# Patient Record
Sex: Female | Born: 1960 | Race: Black or African American | Hispanic: No | Marital: Married | Smoking: Never smoker
Health system: Southern US, Community
[De-identification: ages and names within clinical notes are randomized; demographics above are authoritative.]

## PROBLEM LIST (undated history)

## (undated) DIAGNOSIS — E785 Hyperlipidemia, unspecified: Secondary | ICD-10-CM

---

## 1997-12-06 ENCOUNTER — Other Ambulatory Visit: Admission: RE | Admit: 1997-12-06 | Discharge: 1997-12-06 | Payer: Self-pay | Admitting: Gynecology

## 1998-12-09 ENCOUNTER — Other Ambulatory Visit: Admission: RE | Admit: 1998-12-09 | Discharge: 1998-12-09 | Payer: Self-pay | Admitting: Gynecology

## 1999-12-30 ENCOUNTER — Other Ambulatory Visit: Admission: RE | Admit: 1999-12-30 | Discharge: 1999-12-30 | Payer: Self-pay | Admitting: Gynecology

## 2000-08-10 HISTORY — PX: ABDOMINAL HYSTERECTOMY: SHX81

## 2001-01-24 ENCOUNTER — Other Ambulatory Visit: Admission: RE | Admit: 2001-01-24 | Discharge: 2001-01-24 | Payer: Self-pay | Admitting: Gynecology

## 2001-03-07 ENCOUNTER — Encounter (INDEPENDENT_AMBULATORY_CARE_PROVIDER_SITE_OTHER): Payer: Self-pay

## 2001-03-07 ENCOUNTER — Inpatient Hospital Stay (HOSPITAL_COMMUNITY): Admission: RE | Admit: 2001-03-07 | Discharge: 2001-03-09 | Payer: Self-pay | Admitting: Gynecology

## 2002-01-18 ENCOUNTER — Other Ambulatory Visit: Admission: RE | Admit: 2002-01-18 | Discharge: 2002-01-18 | Payer: Self-pay | Admitting: Gynecology

## 2003-01-23 ENCOUNTER — Other Ambulatory Visit: Admission: RE | Admit: 2003-01-23 | Discharge: 2003-01-23 | Payer: Self-pay | Admitting: Gynecology

## 2003-01-26 ENCOUNTER — Encounter: Admission: RE | Admit: 2003-01-26 | Discharge: 2003-01-26 | Payer: Self-pay | Admitting: Gynecology

## 2003-01-26 ENCOUNTER — Encounter: Payer: Self-pay | Admitting: Gynecology

## 2004-01-29 ENCOUNTER — Other Ambulatory Visit: Admission: RE | Admit: 2004-01-29 | Discharge: 2004-01-29 | Payer: Self-pay | Admitting: Gynecology

## 2004-02-27 ENCOUNTER — Ambulatory Visit (HOSPITAL_COMMUNITY): Admission: RE | Admit: 2004-02-27 | Discharge: 2004-02-27 | Payer: Self-pay | Admitting: Orthopedic Surgery

## 2005-03-18 ENCOUNTER — Other Ambulatory Visit: Admission: RE | Admit: 2005-03-18 | Discharge: 2005-03-18 | Payer: Self-pay | Admitting: Gynecology

## 2005-03-19 ENCOUNTER — Encounter: Admission: RE | Admit: 2005-03-19 | Discharge: 2005-03-19 | Payer: Self-pay | Admitting: Gynecology

## 2006-03-15 ENCOUNTER — Other Ambulatory Visit: Admission: RE | Admit: 2006-03-15 | Discharge: 2006-03-15 | Payer: Self-pay | Admitting: Gynecology

## 2008-05-01 ENCOUNTER — Ambulatory Visit (HOSPITAL_COMMUNITY): Admission: RE | Admit: 2008-05-01 | Discharge: 2008-05-01 | Payer: Self-pay | Admitting: Gynecology

## 2009-06-27 ENCOUNTER — Ambulatory Visit (HOSPITAL_COMMUNITY): Admission: RE | Admit: 2009-06-27 | Discharge: 2009-06-27 | Payer: Self-pay | Admitting: Gynecology

## 2010-12-26 NOTE — Op Note (Signed)
[facility]  Patient:    Erica Cameron, Erica Cameron            MRN: [id number] Proc. Date: 03/07/01 Adm. Date:  [id number] Attending:  Katrina Stack                           Operative Report  PREOPERATIVE DIAGNOSES:  Uterine leiomyomata with dyspareunia and diminished bladder capacity, 18-20 week size.  POSTOPERATIVE DIAGNOSES:  Uterine leiomyomata with dyspareunia and diminished bladder capacity, 18-20 week size.  PROCEDURES:  Total abdominal hysterectomy.  SURGEON:  Gretta Cool, M.D.  ASSISTANT:  Raynald Kemp, M.D.  ANESTHESIA:  General orotracheal.  DESCRIPTION OF PROCEDURE:  Under excellent anesthesia as above, the patients prepped and draped as a sterile field, a _____ incision was made.  The incision was extended through the fascia.  The rectus muscles were then separated in the midline and the peritoneum opened.  The abdomen was then explored and there were no abnormalities in the upper abdomen.  The cecum was very mobile and the appendix was normal.  The uterus was approximately 20-week size with multiple large fibroleiomyoma protruding the fundus; they were largely subserosal.  The uterus was mobilized and delivered up through the incision.  The adnexal pedicle was then clamped across with [facility] clamps.  The round ligaments were then transected with cautery.  The anterior leaf of the round ligament was then dissected off the lower uterine segment and bladder pushed beyond the cervix.  The ovarian ligaments on each side were then clamped, cut, sutured, and tied with 0 Vicryl.  A second free tie was used to doubly ligate the ovarian pedicle.  The uterine vessels were then skeletonized, clamp, cut, sutured, and tied with 0 Vicryl.  The cardinal and uterosacral ligaments were then progressively clamp, cut, sutured, and tied with 0 Vicryl.  The cervix was then excised and the vagina closed with running suture of 0 Vicryl.  At this  point a uterosacral suspension procedure was done with 0 Ethibond. The _____ were all dry at this point, and the pelvic peritoneum was closed with a running suture of 2-0 Monocryl.  The ovaries and tubes were suspended at each round ligament.   The packs and retractors were then removed.  Pelvis was irrigated to remove all debris, and the organs returned to their normal location.  The abdominal peritoneum was then closed with a running suture of 0 Monocryl.  The rectus muscles were also plicated in the midline with a running suture of 0 Monocryl.  The fascia was then approximated with a running suture of 0 Vicryl from each angle of the midline.  Subcutaneous tissue was approximated with interrupted sutures of 3-0 Vicryl and skin closed with skin staples and Steri-STrips.  At the end of the procedure sponge, instrument, and needle counts were correct.  There was no complications.  The patient returned to the recovery room in excellent condition. DD:  03/08/01 TD:  03/08/01 Job: [id number]

## 2010-12-26 NOTE — H&P (Signed)
[facility]  Patient:    Erica Cameron, Erica Cameron            MRN: [id number] Adm. Date:  [id number] Attending:  Katrina Stack                         History and Physical  CHIEF COMPLAINT:  Uterine fibroids and abnormal bleeding.  HISTORY OF PRESENT ILLNESS:  A 50 year old white gravida 1, para 1 with rapid growth of uterine leiomyomata now to approximately 18 weeks size with uterus rising to within 1 cm of the umbilicus.  She has multiple knobby leiomyomata protruding from the fundus.  She also has continued growth of the uterine leiomyomata in spite of hormone replacement suppression.  She has nocturia three to four times per night with diminished bladder capacity all of the time.  She does also experience pain with intercourse which has been reasonably well controlled on Ortho-Cyclen with reasonable cycles and diminished menstrual flow.  She is now sufficiently uncomfortable, however, that she wishes to proceed with definitive therapy.  PAST MEDICAL HISTORY:  Usual childhood diseases without sequela. Accidents/injuries:  None.  Previous hospitalizations for childbirth only.  ALLERGIES:  None.  FAMILY HISTORY:  Mother died of heart disease at age 84.  Father died of lung cancer.  One sister has renal failure and hypertension.  One has diabetes.  CURRENT MEDICATIONS:  Ortho-Cyclen.  SOCIAL HISTORY:  Patient is recently remarried to long-term partner.  She works for [employer].  Her child is grown and living alone.  PHYSICAL EXAMINATION  GENERAL:  Well-developed, thin black female.  HEENT:  Pupils are equal, round and reactive to light and accommodate.  Fundi not examined.  Oropharynx:  Clear.  NECK:  Supple without mass or thyroid enlargement.  CHEST:  Clear PTA.  HEART:  Regular rhythm without murmur or cardiac enlargement.  BREASTS:  Soft without mass, nodes, or nipple discharge.  ABDOMEN:  Soft with a palpable mass  rising to within 1 cm of the umbilicus with quite mobile fundus, but multiple leiomyomata palpable bulging the anterior abdominal wall to the appearance of approximately 18-20 weeks size pregnancy.  There are no other masses.  PELVIC:  External genitalia:  Normal female.  Vagina:  Clean, rugose.  Cervix is pulled high up into the vagina.  The uterus is extraordinarily large with multiple leiomyomata palpable.  Ovaries cannot be palpated.  Rectovaginal examination confirms.  EXTREMITIES:  Negative.  NEUROLOGIC:  Physiologic.  IMPRESSION:  Uterine leiomyomata with approximately 20 weeks size with dyspareunia and diminished bladder capacity.  PLAN:  Total abdominal hysterectomy, possible salpingo-oophorectomy. Discussed with patient the risks, benefits, alternatives.  Discussed management of pain or discomfort in recovery, possible need for hormone replacement, etcetera.  She is now admitted for definitive therapy by total abdominal hysterectomy, possible salpingo-oophorectomy. DD:  03/08/01 TD:  03/08/01 Job: [id number]

## 2012-05-02 ENCOUNTER — Other Ambulatory Visit: Payer: Self-pay | Admitting: Internal Medicine

## 2012-05-03 ENCOUNTER — Ambulatory Visit (INDEPENDENT_AMBULATORY_CARE_PROVIDER_SITE_OTHER): Payer: PRIVATE HEALTH INSURANCE | Admitting: Internal Medicine

## 2012-05-03 ENCOUNTER — Encounter: Payer: Self-pay | Admitting: Internal Medicine

## 2012-05-03 VITALS — BP 126/90 | HR 84 | Temp 98.2°F | Ht 60.0 in | Wt 199.0 lb

## 2012-05-03 DIAGNOSIS — Z79899 Other long term (current) drug therapy: Secondary | ICD-10-CM

## 2012-05-03 DIAGNOSIS — E785 Hyperlipidemia, unspecified: Secondary | ICD-10-CM

## 2012-05-03 DIAGNOSIS — E669 Obesity, unspecified: Secondary | ICD-10-CM

## 2012-05-03 LAB — HEPATIC FUNCTION PANEL
Albumin: 4.2 g/dL (ref 3.5–5.2)
Total Bilirubin: 0.7 mg/dL (ref 0.3–1.2)
Total Protein: 7.6 g/dL (ref 6.0–8.3)

## 2012-05-03 LAB — LIPID PANEL
HDL: 58 mg/dL (ref >39)
LDL Cholesterol: 116 mg/dL — ABNORMAL HIGH (ref 0–99)
Triglycerides: 90 mg/dL (ref <150)
VLDL: 18 mg/dL (ref 0–40)

## 2012-05-08 ENCOUNTER — Encounter: Payer: Self-pay | Admitting: Internal Medicine

## 2012-05-08 DIAGNOSIS — E669 Obesity, unspecified: Secondary | ICD-10-CM | POA: Insufficient documentation

## 2012-05-08 DIAGNOSIS — E785 Hyperlipidemia, unspecified: Secondary | ICD-10-CM | POA: Insufficient documentation

## 2012-05-08 NOTE — Progress Notes (Signed)
  Subjective:    Patient ID: Linward Natal, female    DOB: Aug 13, 1960, 51 y.o.   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 Nicholas Lose for GYN care. Has been on simvastatin 20 mg daily for hyperlipidemia per GYN. Recently GYN informed her she needed to have primary care doctor. Patient does not smoke. Does drink some wine on occasion. Walks several times weekly. Does take aspirin 81 mg daily. Is also on vitamin D supplement and phentermine 37.5 mg daily for weight loss.  Family history: Mother living father died at age 84 with lung cancer. One sister. One daughter.  Patient says she is compliant with simvastatin 20 mg daily. Do not have records from Dr. Nicholas Lose at this point in time but we will try to get these to see results of previous lipid panels. She will get influenza immunization through her employment.    Review of Systems     Objective:   Physical Exam skin is warm and dry, nodes none, HEENT exam: TMs and pharynx are clear; neck is supple without thyromegaly. Chest clear to auscultation. Cardiac exam regular rate and rhythm normal S1 and S2. Extremities without deformity.        Assessment & Plan:  Hyperlipidemia-currently on simvastatin 20 mg daily. Fasting lipid panel drawn  Obesity-currently on phentermine for another physician.  Plan: Patient is return in 6 months for office visit and fasting lipid panel and liver functions. Can continue to get primary care services through GYN physician. She is to get influenza immunization through employment.  Addendum: LDL cholesterol is 116. Lipid panel otherwise normal. Liver functions normal.

## 2012-05-08 NOTE — Patient Instructions (Addendum)
Continue same dose of simvastatin return in 6 months. Diet exercise and weight loss.

## 2012-06-03 ENCOUNTER — Other Ambulatory Visit: Payer: Self-pay

## 2012-06-03 MED ORDER — SIMVASTATIN 20 MG PO TABS: 20.0000 mg | ORAL_TABLET | Freq: Every evening | ORAL | Status: DC

## 2012-09-23 ENCOUNTER — Emergency Department (HOSPITAL_BASED_OUTPATIENT_CLINIC_OR_DEPARTMENT_OTHER)
Admission: EM | Admit: 2012-09-23 | Discharge: 2012-09-24 | Disposition: A | Discharge status: Home / Self Care | Payer: PRIVATE HEALTH INSURANCE | Attending: Emergency Medicine | Admitting: Emergency Medicine

## 2012-09-23 ENCOUNTER — Encounter (HOSPITAL_BASED_OUTPATIENT_CLINIC_OR_DEPARTMENT_OTHER): Payer: Self-pay | Admitting: Emergency Medicine

## 2012-09-23 DIAGNOSIS — R05 Cough: Secondary | ICD-10-CM | POA: Insufficient documentation

## 2012-09-23 DIAGNOSIS — J3489 Other specified disorders of nose and nasal sinuses: Secondary | ICD-10-CM | POA: Insufficient documentation

## 2012-09-23 DIAGNOSIS — R059 Cough, unspecified: Secondary | ICD-10-CM | POA: Insufficient documentation

## 2012-09-23 DIAGNOSIS — R1110 Vomiting, unspecified: Secondary | ICD-10-CM | POA: Insufficient documentation

## 2012-09-23 DIAGNOSIS — B349 Viral infection, unspecified: Secondary | ICD-10-CM

## 2012-09-23 DIAGNOSIS — Z79899 Other long term (current) drug therapy: Secondary | ICD-10-CM | POA: Insufficient documentation

## 2012-09-23 DIAGNOSIS — B9789 Other viral agents as the cause of diseases classified elsewhere: Secondary | ICD-10-CM | POA: Insufficient documentation

## 2012-09-23 DIAGNOSIS — E785 Hyperlipidemia, unspecified: Secondary | ICD-10-CM | POA: Insufficient documentation

## 2012-09-23 DIAGNOSIS — Z7982 Long term (current) use of aspirin: Secondary | ICD-10-CM | POA: Insufficient documentation

## 2012-09-23 HISTORY — DX: Hyperlipidemia, unspecified: E78.5

## 2012-09-23 NOTE — ED Notes (Signed)
Cough, chills, fever x 5 days.  Vomited last Sunday.

## 2012-09-24 ENCOUNTER — Emergency Department (HOSPITAL_BASED_OUTPATIENT_CLINIC_OR_DEPARTMENT_OTHER): Payer: PRIVATE HEALTH INSURANCE

## 2012-09-24 MED ORDER — IBUPROFEN 600 MG PO TABS: 600.0000 mg | ORAL_TABLET | Freq: Four times a day (QID) | ORAL | Status: AC | PRN

## 2012-09-24 MED ORDER — BENZONATATE 100 MG PO CAPS: 100.0000 mg | ORAL_CAPSULE | Freq: Three times a day (TID) | ORAL | Status: AC | PRN

## 2012-09-24 NOTE — ED Provider Notes (Signed)
History     CSN: [id number]  Arrival date & time 09/23/12  2305   First MD Initiated Contact with Patient 09/23/12 2348      Chief Complaint  Patient presents with  . Cough  . Fever    (Consider location/radiation/quality/duration/timing/severity/associated sxs/prior treatment) Patient is a 52 y.o. female presenting with cough and fever. The history is provided by the patient. No language interpreter was used.  Cough Cough characteristics:  Non-productive Severity:  Moderate Onset quality:  Gradual Duration:  5 days Timing:  Constant Progression:  Unchanged Chronicity:  New Smoker: no   Context: not animal exposure   Relieved by:  Nothing Worsened by:  Nothing tried Ineffective treatments:  None tried Associated symptoms: fever   Associated symptoms: no chest pain, no ear pain, no myalgias, no rash, no sore throat, no weight loss and no wheezing   Fever:    Timing:  Constant   Temp source:  Oral Risk factors: no recent infection   Fever Max temp prior to arrival:  102 Temp source:  Oral Severity:  Moderate Onset quality:  Gradual Duration:  5 days Timing:  Constant Progression:  Unchanged Chronicity:  New Relieved by:  Ibuprofen Worsened by:  Nothing tried Ineffective treatments:  None tried Associated symptoms: congestion, cough and vomiting   Associated symptoms: no chest pain, no diarrhea, no ear pain, no myalgias, no rash and no sore throat   Associated symptoms comment:  Had vomiting and achiness on Sunday Risk factors: no recent travel     Past Medical History  Diagnosis Date  . Hyperlipidemia     Past Surgical History  Procedure Laterality Date  . Abdominal hysterectomy  2002    No family history on file.  History  Substance Use Topics  . Smoking status: Never Smoker   . Smokeless tobacco: Never Used  . Alcohol Use: Yes     Comment: socially    OB History   Grav Para Term Preterm Abortions TAB SAB Ect Mult Living                   Review of Systems  Constitutional: Positive for fever. Negative for weight loss.  HENT: Positive for congestion. Negative for ear pain and sore throat.   Respiratory: Positive for cough. Negative for wheezing.   Cardiovascular: Negative for chest pain and leg swelling.  Gastrointestinal: Positive for vomiting. Negative for diarrhea.  Musculoskeletal: Negative for myalgias.  Skin: Negative for rash.  All other systems reviewed and are negative.    Allergies  Review of patient's allergies indicates no known allergies.  Home Medications   Current Outpatient Rx  Name  Route  Sig  Dispense  Refill  . aspirin 81 MG tablet   Oral   Take 81 mg by mouth daily.         . Cholecalciferol (VITAMIN D) 2000 UNITS CAPS   Oral   Take by mouth.         . simvastatin (ZOCOR) 20 MG tablet   Oral   Take 1 tablet (20 mg total) by mouth every evening.   30 tablet   5     BP 111/75  Pulse 115  Temp(Src) 100.1 F (37.8 C)  Ht 4\' 11"  (1.499 m)  Wt 197 lb (89.359 kg)  BMI 39.77 kg/m2  SpO2 95%  Physical Exam  Constitutional: She is oriented to person, place, and time. She appears well-developed and well-nourished. No distress.  HENT:  Head: Normocephalic and atraumatic. Not  macrocephalic.  Right Ear: No mastoid tenderness. Tympanic membrane is not injected.  Left Ear: No mastoid tenderness. Tympanic membrane is not injected.  Mouth/Throat: Oropharynx is clear and moist.  Clear colorless post nasal drainage  Eyes: Conjunctivae and EOM are normal. Pupils are equal, round, and reactive to light.  Neck: Normal range of motion. Neck supple. No tracheal deviation present.  No meningeal signs  Cardiovascular: Normal rate, regular rhythm and intact distal pulses.   Pulmonary/Chest: Effort normal and breath sounds normal. No respiratory distress. She has no wheezes. She has no rales. She exhibits no tenderness.  Abdominal: Soft. Bowel sounds are normal. There is no tenderness. There  is no rebound and no guarding.  Musculoskeletal: Normal range of motion.  Neurological: She is alert and oriented to person, place, and time.  Skin: Skin is warm and dry.  Psychiatric: She has a normal mood and affect.    ED Course  Procedures (including critical care time)  Labs Reviewed - No data to display No results found.   No diagnosis found.    MDM  Viral syndrome for 5 days.  Will treat symptomatically with ibuprofen and tesselon        Park Beck K Addis Tuohy-Rasch, MD 09/24/12 4696

## 2013-04-17 ENCOUNTER — Other Ambulatory Visit: Payer: Self-pay | Admitting: Internal Medicine

## 2013-04-17 NOTE — Telephone Encounter (Signed)
Please refill for 2 months. Last OV 05/03/12. Does not need CPE. Needs OV lipid and liver panel in October.

## 2013-07-26 ENCOUNTER — Other Ambulatory Visit: Payer: Self-pay | Admitting: Internal Medicine

## 2014-01-05 ENCOUNTER — Other Ambulatory Visit: Payer: Self-pay | Admitting: Internal Medicine

## 2014-01-08 ENCOUNTER — Telehealth: Payer: Self-pay | Admitting: Internal Medicine

## 2014-01-08 NOTE — Telephone Encounter (Signed)
Must get back on Lipid med for several weeks before results can be checked. Can refill for 4-8 weeks Pt knows well she should have been here for recheck before now. That is why Rx refill was refused this past weekend. She has gyn for primary care  Results for orders placed in visit on 05/03/12  LIPID PANEL      Result Value Ref Range   Cholesterol 192  0 - 200 mg/dL   Triglycerides 90  <245 mg/dL   HDL 58  >80 mg/dL   Total CHOL/HDL Ratio 3.3     VLDL 18  0 - 40 mg/dL   LDL Cholesterol 998 (*) 0 - 99 mg/dL  HEPATIC FUNCTION PANEL      Result Value Ref Range   Total Bilirubin 0.7  0.3 - 1.2 mg/dL   Bilirubin, Direct 0.1  0.0 - 0.3 mg/dL   Indirect Bilirubin 0.6  0.0 - 0.9 mg/dL   Alkaline Phosphatase 73  39 - 117 U/L   AST 18  0 - 37 U/L   ALT 19  0 - 35 U/L   Total Protein 7.6  6.0 - 8.3 g/dL   Albumin 4.2  3.5 - 5.2 g/dL   visit will be for lipid management only.

## 2014-01-08 NOTE — Telephone Encounter (Signed)
Left message for patient on her work # to call me to schedule CPE in July.  Dr. Lenord Fellers is refilling Zocor 20mg  qhs #30 with 2 refills.  This will provide the patient to get back on her Lipid meds and then be on them consistently for about 8 weeks until time to draw labs for CPE.  Awaiting patient to return call to book CPE.

## 2014-03-08 ENCOUNTER — Other Ambulatory Visit: Payer: Self-pay | Admitting: Internal Medicine

## 2014-03-09 ENCOUNTER — Encounter: Payer: Self-pay | Admitting: Internal Medicine

## 2014-04-10 IMAGING — CR DG CHEST 2V
2 series · 2 of 2 positions shown · non-contrast
Comparison: None.

CLINICAL DATA: Cough, fever and chills.

CHEST - 2 VIEW

[w chest pa]
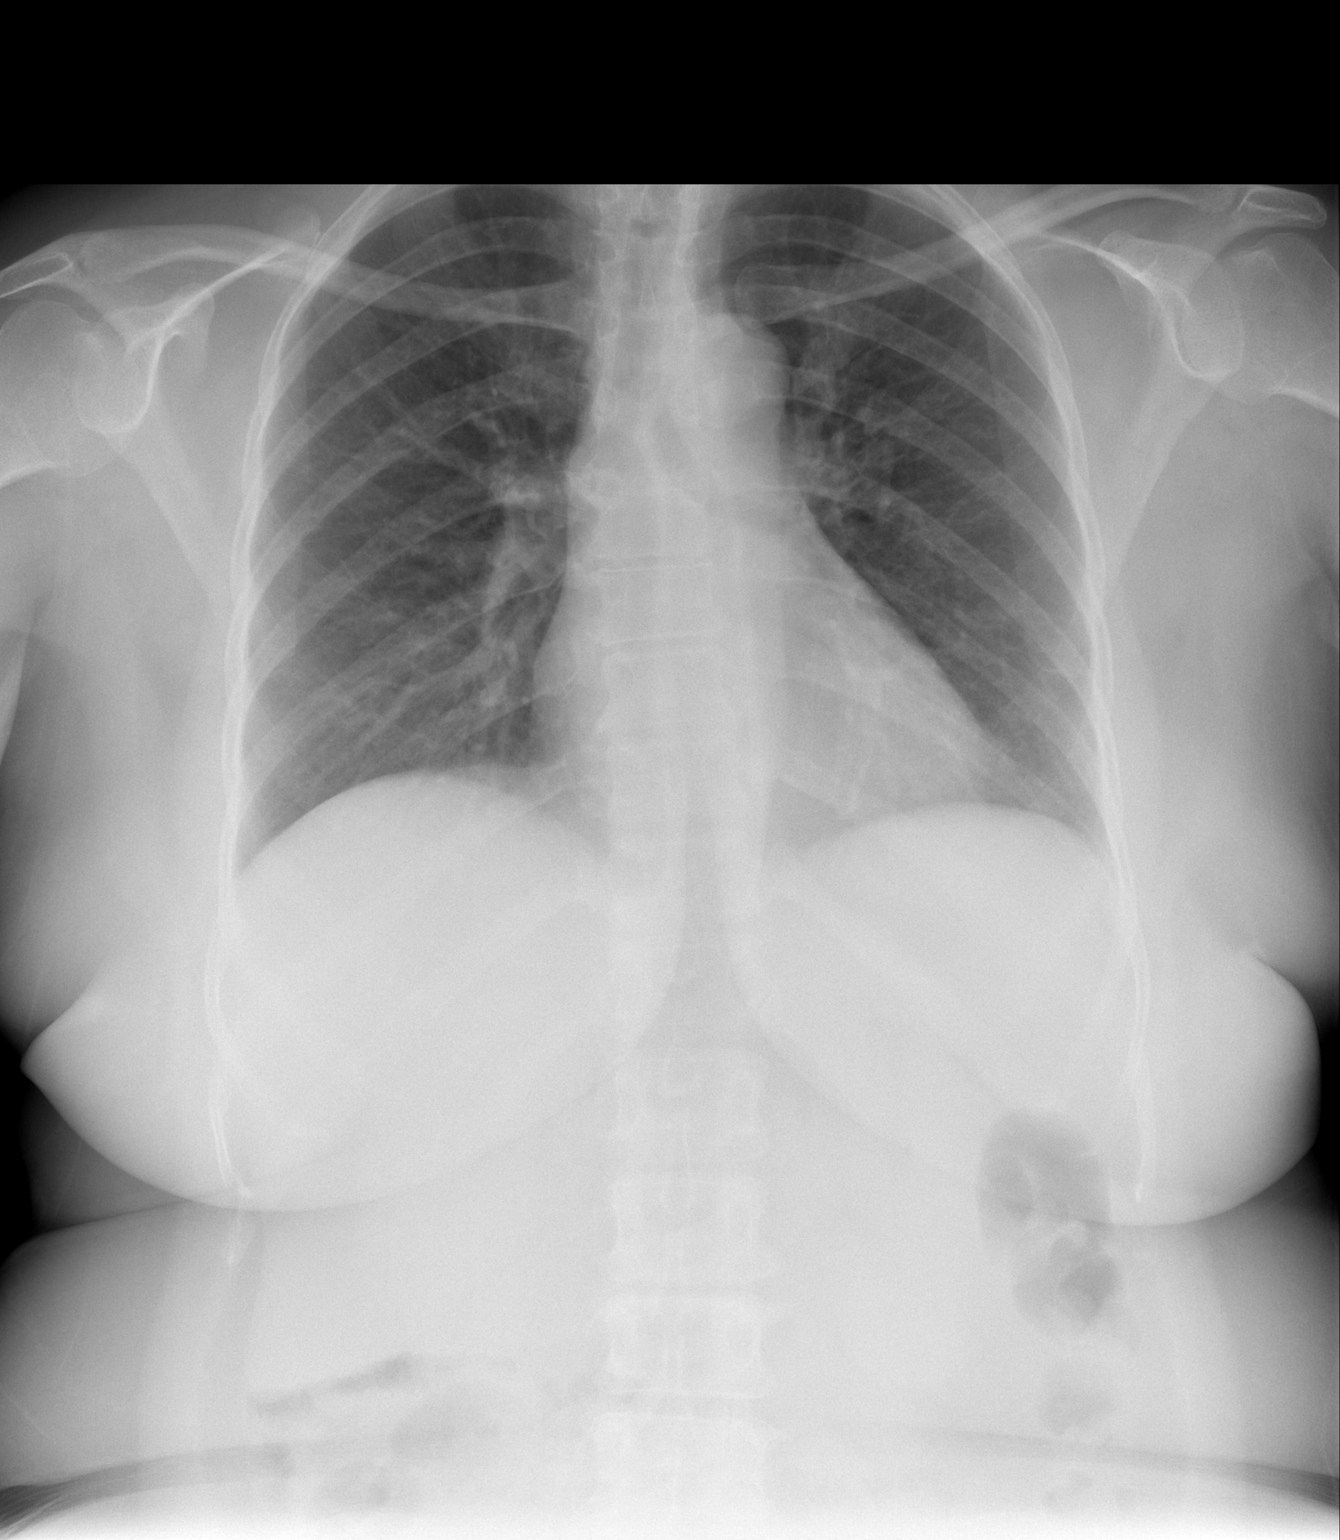

[w chest lat]
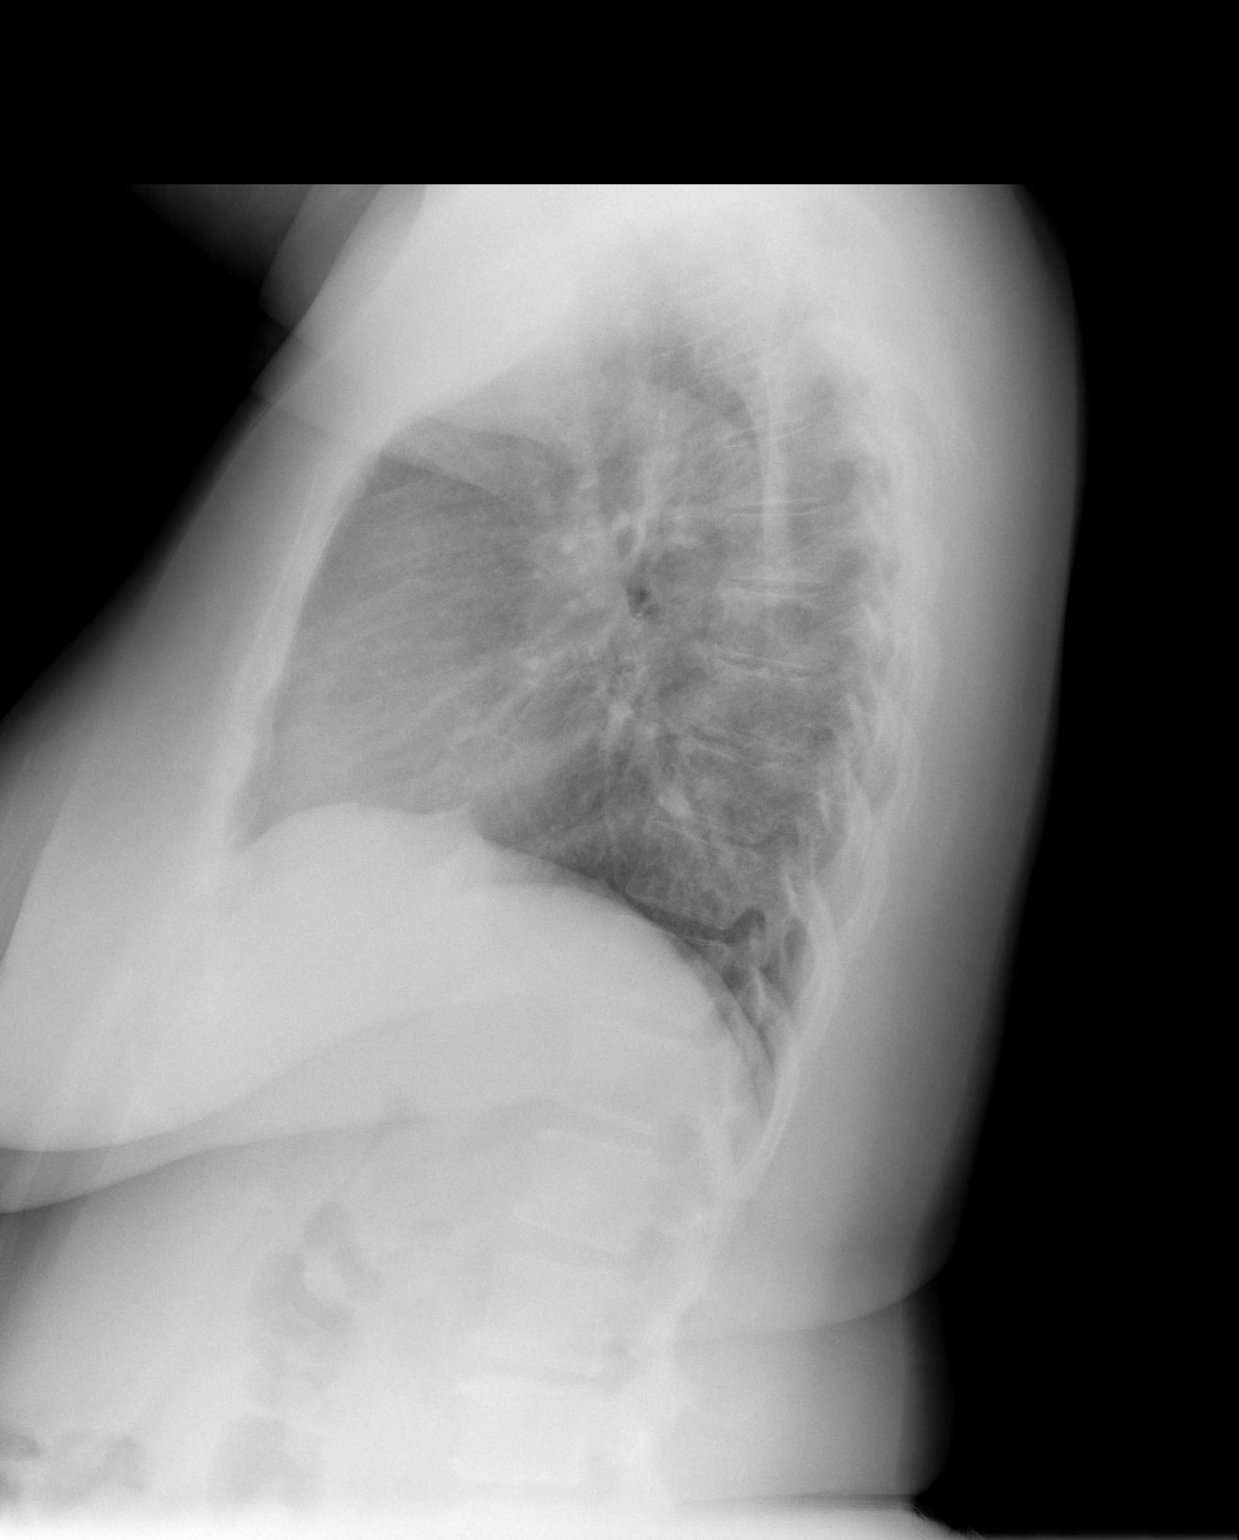

[2 of 2 positions shown; findings below may reference images not displayed]

FINDINGS: The lungs are well-aerated and clear.  There is no
evidence of focal opacification, pleural effusion or pneumothorax.

The heart is normal in size; the mediastinal contour is within
normal limits.  No acute osseous abnormalities are seen.
IMPRESSION: No acute cardiopulmonary process seen.

## 2020-05-23 MED FILL — LISINOPRIL-HCTZ 10-12.5 MG: 10-12.5 | 90 days supply | Qty: 90 | Fill #0

## 2020-05-23 MED FILL — TRULICITY 0.75 MG/0.5 ML PE: 0.75 | 28 days supply | Qty: 2 | Fill #0

## 2020-05-23 MED FILL — SIMVASTATIN 10 MG TAB: 10 | 90 days supply | Qty: 90 | Fill #0

## 2020-05-23 MED FILL — METFORMIN HCL 500 MG TABS: 500 | 90 days supply | Qty: 180 | Fill #0
